# Patient Record
Sex: Female | Born: 1985 | Hispanic: Yes | Marital: Married | Smoking: Never smoker
Health system: Southern US, Community
[De-identification: ages and names within clinical notes are randomized; demographics above are authoritative.]

## PROBLEM LIST (undated history)

## (undated) DIAGNOSIS — I10 Essential (primary) hypertension: Secondary | ICD-10-CM

## (undated) NOTE — Progress Notes (Signed)
 Formatting of this note might be different from the original.  09/17/2014   These orders are being generated as the result of the [facility] Screening and Indicated Tests Ordering Protocol.  Signed by: Penne JAYSON Meth, LPN 02/07/7982, 7:65 PM  Electronically signed by Penne JAYSON Meth, LPN at 97/91/7983  2:35 PM MST

## (undated) NOTE — Addendum Note (Signed)
"   Addended by: CYNDEE PENNE BROCKS on: 09/17/2014 02:35 PM    Modules accepted: Orders, SmartSet   Electronically signed by Penne BROCKS Cyndee, LPN at 97/91/7983  2:35 PM MST "

## (undated) NOTE — Progress Notes (Signed)
 Formatting of this note might be different from the original. SUBJECTIVE: F/u on right foot sprain from 09/04/2014, missed step and twisted foot; had xrays done which were negative  Used crutches for first few  days after injury; did develop bruising a few days after clinic visit which has since resolved; also initial swelling much improved  Using cam walker still, sometimes not wearing it when around home, not wearing it when driving  Using ice at night time  Using ibuprofen once per day now  Due for labs Due for TdaP Last pap smear done 2 years, no hx of abnormal pap smears, declines pap today.   OBJECTIVE: gen; alert and Ox3, nad, well appearing BP 108/78 mmHg  Pulse 84  Temp(Src) 98.7 F (37.1 C) (Tympanic)  Resp 18  Ht 5' 3 (1.6 m)  Wt 174 lb 1.6 oz (78.971 kg)  BMI 30.85 kg/m2  SpO2 97%  LMP  (Exact Date) rigth foot; no edema, faint very small resolving ecchymosis at  Dorsal first web space  Mild ttp at 2nd, 3rd,4th metatarsals; rom intact ankle and toes   ASSESSMENT: RIGHT FOOT SPRAIN, SUBSEQ  (primary encounter diagnosis) VACCINATION FOR DIPHTHERIA, TETANUS AND ACELLULAR PERTUSSIS   PLAN: See pt. instructions   Electronically signed by Greig LITTIE Flor, MD at 09/17/2014  2:28 PM MST

## (undated) NOTE — Nursing Note (Signed)
 Formatting of this note might be different from the original. Patient identification verified, roomed and vital signs taken. Mitzie Gull LPN Nursing Float Pool 09/17/2014 2:02 PM   Electronically signed by Mitzie Gull, LPN at 97/91/7983  2:03 PM MST

---

## 2020-10-11 ENCOUNTER — Encounter (HOSPITAL_BASED_OUTPATIENT_CLINIC_OR_DEPARTMENT_OTHER): Payer: Self-pay | Admitting: Emergency Medicine

## 2020-10-11 ENCOUNTER — Other Ambulatory Visit: Payer: Self-pay

## 2020-10-11 ENCOUNTER — Emergency Department (HOSPITAL_BASED_OUTPATIENT_CLINIC_OR_DEPARTMENT_OTHER)
Admission: EM | Admit: 2020-10-11 | Discharge: 2020-10-11 | Disposition: A | Discharge status: Home / Self Care | Payer: Managed Care, Other (non HMO) | Attending: Emergency Medicine | Admitting: Emergency Medicine

## 2020-10-11 ENCOUNTER — Emergency Department (HOSPITAL_BASED_OUTPATIENT_CLINIC_OR_DEPARTMENT_OTHER): Payer: Managed Care, Other (non HMO)

## 2020-10-11 ENCOUNTER — Emergency Department (HOSPITAL_COMMUNITY): Payer: Managed Care, Other (non HMO)

## 2020-10-11 DIAGNOSIS — Z20822 Contact with and (suspected) exposure to covid-19: Secondary | ICD-10-CM | POA: Insufficient documentation

## 2020-10-11 DIAGNOSIS — R200 Anesthesia of skin: Secondary | ICD-10-CM | POA: Insufficient documentation

## 2020-10-11 DIAGNOSIS — R519 Headache, unspecified: Secondary | ICD-10-CM | POA: Insufficient documentation

## 2020-10-11 DIAGNOSIS — R001 Bradycardia, unspecified: Secondary | ICD-10-CM | POA: Insufficient documentation

## 2020-10-11 DIAGNOSIS — R2990 Unspecified symptoms and signs involving the nervous system: Secondary | ICD-10-CM | POA: Diagnosis not present

## 2020-10-11 DIAGNOSIS — I10 Essential (primary) hypertension: Secondary | ICD-10-CM | POA: Insufficient documentation

## 2020-10-11 DIAGNOSIS — R29810 Facial weakness: Secondary | ICD-10-CM | POA: Insufficient documentation

## 2020-10-11 DIAGNOSIS — R439 Unspecified disturbances of smell and taste: Secondary | ICD-10-CM | POA: Diagnosis present

## 2020-10-11 HISTORY — DX: Essential (primary) hypertension: I10

## 2020-10-11 LAB — RAPID URINE DRUG SCREEN, HOSP PERFORMED
Amphetamines: NOT DETECTED
Barbiturates: NOT DETECTED
Benzodiazepines: NOT DETECTED
Cocaine: NOT DETECTED
Opiates: NOT DETECTED
Tetrahydrocannabinol: NOT DETECTED

## 2020-10-11 LAB — RESP PANEL BY RT-PCR (FLU A&B, COVID) ARPGX2
Influenza A by PCR: NEGATIVE
Influenza B by PCR: NEGATIVE
SARS Coronavirus 2 by RT PCR: NEGATIVE

## 2020-10-11 LAB — CBC WITH DIFFERENTIAL/PLATELET
Abs Immature Granulocytes: 0.02 10*3/uL (ref 0.00–0.07)
Basophils Absolute: 0 10*3/uL (ref 0.0–0.1)
Basophils Relative: 0 %
Eosinophils Absolute: 0.1 10*3/uL (ref 0.0–0.5)
Eosinophils Relative: 1 %
HCT: 39.9 % (ref 36.0–46.0)
Hemoglobin: 13 g/dL (ref 12.0–15.0)
Immature Granulocytes: 0 %
Lymphocytes Relative: 27 %
Lymphs Abs: 2.8 10*3/uL (ref 0.7–4.0)
MCH: 29.2 pg (ref 26.0–34.0)
MCHC: 32.6 g/dL (ref 30.0–36.0)
MCV: 89.7 fL (ref 80.0–100.0)
Monocytes Absolute: 0.7 10*3/uL (ref 0.1–1.0)
Monocytes Relative: 7 %
Neutro Abs: 6.4 10*3/uL (ref 1.7–7.7)
Neutrophils Relative %: 65 %
Platelets: 286 10*3/uL (ref 150–400)
RBC: 4.45 MIL/uL (ref 3.87–5.11)
RDW: 13.2 % (ref 11.5–15.5)
WBC: 10 10*3/uL (ref 4.0–10.5)
nRBC: 0 % (ref 0.0–0.2)

## 2020-10-11 LAB — BASIC METABOLIC PANEL
Anion gap: 10 (ref 5–15)
BUN: 15 mg/dL (ref 6–20)
CO2: 26 mmol/L (ref 22–32)
Calcium: 9.4 mg/dL (ref 8.9–10.3)
Chloride: 103 mmol/L (ref 98–111)
Creatinine, Ser: 0.61 mg/dL (ref 0.44–1.00)
GFR, Estimated: >60 mL/min (ref >60)
Glucose, Bld: 110 mg/dL — ABNORMAL HIGH (ref 70–99)
Potassium: 4.1 mmol/L (ref 3.5–5.1)
Sodium: 139 mmol/L (ref 135–145)

## 2020-10-11 LAB — PREGNANCY, URINE: Preg Test, Ur: NEGATIVE

## 2020-10-11 MED ORDER — GADOBUTROL 1 MMOL/ML IV SOLN
7.5000 mL | Freq: Once | INTRAVENOUS | Status: AC | PRN
  Administered 2020-10-11: 7.5 mL via INTRAVENOUS

## 2020-10-11 NOTE — ED Provider Notes (Signed)
[facility] EMERGENCY DEPARTMENT Provider Note   CSN: [id number] Arrival date & time: 10/11/20  1050     History No chief complaint on file.   Monique Hayden is a 35 y.o. female who presents with concern for loss of taste that started yesterday with associated altered sensation on the RIGHT half of her face, states she feels like she needs to use excessive effort to blink or make facial expression on the right side.  This morning she noted that she felt like she could not grip things as strongly with her right hand that she can with her left.  Patient had video visit with her doctor this morning who noted a left-sided facial droop, and recommend she come to the emergency department to rule out a stroke.  Patient is tearful provisional exam, fearful as she has a 62-year-old daughter.   LEFT sided facial droop noted in triage.  Patient ambulatory without difficulty, moves all extremities with purpose and without difficulty.  She states she has intermittent headaches and migraines associated with her menstrual cycle, had a mild frontal headache this morning that resolved after ibuprofen administration.  I personally reviewed the patient's medical records.  She has history of gestational hypertension, but otherwise carries no medical diagnoses and is not on any medications every day.  She is not on any anticoagulation, has not had any recent travel or prolonged immobilization, no medical history of blood clots or malignancy.  Recent trauma or falls, patient is not an IV drug user.  HPI     Past Medical History:  Diagnosis Date  . Hypertension    gestational    There are no problems to display for this patient.      OB History   No obstetric history on file.     No family history on file.  Social History   Tobacco Use  . Smoking status: Never Smoker  . Smokeless tobacco: Never Used  Vaping Use  . Vaping Use: Never used  Substance Use Topics  . Alcohol use: Yes  .  Drug use: Never    Home Medications Prior to Admission medications   Not on File    Allergies    Patient has no known allergies.  Review of Systems   Review of Systems  Constitutional: Negative.   HENT: Negative.   Eyes: Negative.   Respiratory: Negative.   Cardiovascular: Negative.   Gastrointestinal: Negative.   Musculoskeletal: Negative.   Skin: Negative.   Neurological: Positive for facial asymmetry, weakness, numbness and headaches. Negative for dizziness, seizures, syncope, speech difficulty and light-headedness.  Hematological: Negative.   Psychiatric/Behavioral: Negative.     Physical Exam Updated Vital Signs BP 123/81 (BP Location: Right Arm)   Pulse 91   Temp 98.6 F (37 C) (Oral)   Resp (!) 22   Ht 5\' 3"  (1.6 m)   Wt 77.1 kg   SpO2 97%   BMI 30.11 kg/m   Physical Exam Vitals and nursing note reviewed.  Constitutional:      Appearance: She is overweight.  HENT:     Head: Normocephalic and atraumatic.     Comments: Facial asymmetry, with LEFT facial droop    Right Ear: Hearing normal.     Left Ear: Hearing normal.     Nose: Nose normal.     Mouth/Throat:     Mouth: Mucous membranes are moist.     Pharynx: Oropharynx is clear. Uvula midline. No oropharyngeal exudate, posterior oropharyngeal erythema or uvula swelling.  Tonsils: No tonsillar exudate.  Eyes:     General: Lids are normal. Vision grossly intact. Gaze aligned appropriately. No visual field deficit.       Right eye: No discharge.        Left eye: No discharge.     Extraocular Movements: Extraocular movements intact.     Right eye: No nystagmus.     Left eye: No nystagmus.     Conjunctiva/sclera: Conjunctivae normal.     Pupils: Pupils are equal, round, and reactive to light.  Neck:     Trachea: Trachea and phonation normal.  Cardiovascular:     Rate and Rhythm: Normal rate and regular rhythm.     Pulses: Normal pulses.          Radial pulses are 2+ on the right side and 2+ on  the left side.       Dorsalis pedis pulses are 2+ on the right side and 2+ on the left side.     Heart sounds: Normal heart sounds. No murmur heard.   Pulmonary:     Effort: Pulmonary effort is normal. No tachypnea or respiratory distress.     Breath sounds: Normal breath sounds. No wheezing or rales.  Chest:     Chest wall: No lacerations, deformity, swelling, tenderness, crepitus or edema.  Abdominal:     General: Bowel sounds are normal. There is no distension.     Palpations: Abdomen is soft.     Tenderness: There is no abdominal tenderness.  Musculoskeletal:        General: No deformity.     Cervical back: Neck supple. No crepitus. No pain with movement, spinous process tenderness or muscular tenderness.     Right lower leg: No edema.     Left lower leg: No edema.     Comments: Asymmetric grip strength with 5/5 grip strength on the left hand, and 3/5 grip strength on the right.  Similar asymmetry with elbow flexion and extension.  5/5 strength in elbow flexion extension on the left, 3/5 strength in flexion extension of the elbow on the right. 5/5 strength in plantar dorsiflexion bilaterally.  Lymphadenopathy:     Cervical: No cervical adenopathy.  Skin:    General: Skin is warm and dry.  Neurological:     Mental Status: She is alert and oriented to person, place, and time. Mental status is at baseline.     Cranial Nerves: Facial asymmetry present. No dysarthria.     Motor: Weakness present. No abnormal muscle tone or pronator drift.     Coordination: Coordination is intact. Romberg sign negative. Heel to [facility] Test normal.     Gait: Gait is intact.     Comments: Facial asymmetry with left-sided facial droop without dysarthria.  CN II-VI, VIII-XII intact, gag reflex not assessed.   Altered sensation to light touch in RUE with associated RUE weakness.   Psychiatric:        Mood and Affect: Mood normal.     ED Results / Procedures / Treatments   Labs (all labs ordered are  listed, but only abnormal results are displayed) Labs Reviewed  BASIC METABOLIC PANEL - Abnormal; Notable for the following components:      Result Value   Glucose, Bld 110 (*)    All other components within normal limits  CBC WITH DIFFERENTIAL/PLATELET  PREGNANCY, URINE  RAPID URINE DRUG SCREEN, HOSP PERFORMED    EKG None  Radiology CT Head Wo Contrast  Result Date: 10/11/2020 CLINICAL DATA:  Facial numbness and tongue numbness. EXAM: CT HEAD WITHOUT CONTRAST TECHNIQUE: Contiguous axial images were obtained from the base of the skull through the vertex without intravenous contrast. COMPARISON:  None. FINDINGS: Brain: No evidence of acute infarction, hemorrhage, hydrocephalus, extra-axial collection or mass lesion/mass effect. Vascular: No hyperdense vessel or unexpected calcification. Skull: Normal. Negative for fracture or focal lesion. Sinuses/Orbits: No acute finding. CLear mastoids. Unremarkable parotids. IMPRESSION: Negative head CT. Electronically Signed   By: Marnee SpringJonathon  Watts M.D.   On: 10/11/2020 11:55    Procedures Procedures   Medications Ordered in ED Medications - No data to display  ED Course  I have reviewed the triage vital signs and the nursing notes.  Pertinent labs & imaging results that were available during my care of the patient were reviewed by me and considered in my medical decision making (see chart for details).  Clinical Course as of 10/11/20 1301  Fri Oct 11, 2020  1225 [facility], Dr. Rodena MedinMessick, who is agreeable to receiving this patient and coordinating with neurology.  [RS]    Clinical Course User Index [RS] Daxton Nydam, Eugene Gaviaebekah R, PA-C   MDM Rules/Calculators/A&P                         35 year old female presents with concern for 1 day of altered sensation in the right half of her face, now with left-sided facial droop and right upper extremity weakness.  No history of multiple sclerosis.  Hypertensive and tachycardic on intake,  tearful in triage.  Cardiopulmonary exam is normal, she is alert tachycardic at time of my exam, abdominal exam is benign.  Neurologic exam revealed left-sided facial droop and right upper extremity weakness and altered sensation.  No other deficit identified on exam.  No visual field loss, patient is able to articulate her speech well.  The differential diagnosis of weakness includes but is not limited to: Marland Kitchen. Neurologic causes: GBS, myasthenia gravis, CVA, MS, ALS, transverse myelitis, spinal cord injury, CVA, botulism . Other causes: ACS, Arrhythmia, syncope, orthostatic hypotension, sepsis, hypoglycemia, electrolyte disturbance, hypothyroidism, respiratory failure, symptomatic anemia, dehydration, heat injury, polypharmacy, malignancy.  No concern for CVA at this time neurologic exam abnormalities inconsistent with CVA, concern for possible multiple sclerosis.  The patient will undergo basic laboratory studies and CT of the head in the emergency department, however I feel she would be best served by undergoing an MRI.  We will arrange ED [facility] ED transfer [facility] for [facility] and neurologic evaluation.  Normal head CT, no concern for intracranial mass at this time. Will arrange ED to ED transfer.  Patient's husband is at the bedside, and is appropriate for [facility].  She is medically stable and appropriate for transfer by POV at this time.  Accepting [facility] at [facility] emergency department Dr. Rodena MedinMessick.  Consult placed to neurology, Dr. Amada JupiterKirkpatrick, who agrees with MRI and plan for ED to ED transfer [facility]. He is agreeable to seeing [facility] when [facility]. I appreciate his collaboration in the care of this patient.   This chart was dictated using voice recognition software, Dragon. Despite the best efforts of this provider to proofread and correct errors, errors may still occur which can change documentation meaning.  Final Clinical Impression(s)  / ED Diagnoses Final diagnoses:  Neurosensory deficit    Rx / DC Orders ED Discharge Orders    None  Sherrilee Gilles 10/11/20 1301    Gwyneth Sprout, MD 10/12/20 2205

## 2020-10-11 NOTE — ED Notes (Signed)
Spoke to MRI to give them a heads up

## 2020-10-11 NOTE — Discharge Instructions (Addendum)
Your work-up today was overall reassuring.  Your MRI and CT scans did not show any evidence of stroke or MS.  As discussed, we think this is less likely Bell's palsy given your upper extremity symptoms which have now resolved.  Please make sure to follow-up with neurology in the outpatient setting, provided their contact information in your discharge paperwork.  Return to the ER for any new or worsening symptoms peer

## 2020-10-11 NOTE — ED Notes (Signed)
Husband at bedside , pt to go to  [facility] for MRI with IV secured , report to [facility] RN charge at [facility]

## 2020-10-11 NOTE — ED Notes (Signed)
Husband at bedside.  

## 2020-10-11 NOTE — ED Triage Notes (Signed)
Loss of sense of taste yesterday and today , this am  Numbness on rt side  Of face and couldn't blink  Rt side   Hand grip not as strong as left  ,  Left side of face is not  Not as strong with smile , MAE with purpose

## 2020-10-11 NOTE — ED Provider Notes (Addendum)
Patient transferred over to [facility] for MRI, initially seen at [facility] with concerns for possible MS.  Patient evaluated by Mauro Kaufmann, please see her note for full HPI.  In short, 35 year old female who complains of left-sided facial droop, loss of taste, and inability to blink as of this morning.  She also had complained of a headache.  Evaluated via telemedicine by PCP and was noted to have left-sided facial droop.  Sent to the ER for rule out of stroke.  She had a visible noted left-sided facial droop on exam, with 3/5 strength in her right arm with gripping and flexion and extension of the elbows.  She had intact and equal strength in her lower extremities.  No noted dysarthria.  CT of the head, lab work overall reassuring.  Prior provider had spoken with with Dr. Amada Jupiter who recommended transfer over to [facility] for MRI to rule out MS.  On arrival, on my exam, patient now has 5/5 grip strength with equal flexion and extension of her upper extremities bilaterally.  She is able to lift her left eyebrow, no notable dysarthria on exam, however she does have a left-sided facial droop. Able to close both eyes completely.  She is able to mildly keep the left cheek taut when puffing out her cheeks. Equal strength in lower extremities bilaterally.   I personally reviewed her MRI which showed no acute abnormalities.  I spoke with Dr. Amada Jupiter again who thinks this could be either complicated migraine or psychogenic.  ABCD 2 score less than 2, less likely TIA or carotid artery dissection/CVA.  Less likely Bell's palsy given upper extremity weakness at presentation.  He does not think a course of steroids would be helpful at this time.  He recommends outpatient follow-up.  We discussed possible Covid testing with the patient, to which she is agreeable to.  We will test today, though this would not change our course of management.  Patient given outpatient neurology follow-up, stressed  following up with them.  Return precautions discussed.  She voiced understanding and is agreeable.  Case discussed with Dr. Stevie Kern who is agreeable to the above plan and disposition  Results for orders placed or performed during the hospital encounter of 10/11/20  Basic metabolic panel  Result Value Ref Range   Sodium 139 135 - 145 mmol/L   Potassium 4.1 3.5 - 5.1 mmol/L   Chloride 103 98 - 111 mmol/L   CO2 26 22 - 32 mmol/L   Glucose, Bld 110 (H) 70 - 99 mg/dL   BUN 15 6 - 20 mg/dL   Creatinine, Ser 4.09 0.44 - 1.00 mg/dL   Calcium 9.4 8.9 - 81.1 mg/dL   GFR, Estimated >91 >47 mL/min   Anion gap 10 5 - 15  CBC with Differential  Result Value Ref Range   WBC 10.0 4.0 - 10.5 K/uL   RBC 4.45 3.87 - 5.11 MIL/uL   Hemoglobin 13.0 12.0 - 15.0 g/dL   HCT 82.9 56.2 - 13.0 %   MCV 89.7 80.0 - 100.0 fL   MCH 29.2 26.0 - 34.0 pg   MCHC 32.6 30.0 - 36.0 g/dL   RDW 86.5 78.4 - 69.6 %   Platelets 286 150 - 400 K/uL   nRBC 0.0 0.0 - 0.2 %   Neutrophils Relative % 65 %   Neutro Abs 6.4 1.7 - 7.7 K/uL   Lymphocytes Relative 27 %   Lymphs Abs 2.8 0.7 - 4.0 K/uL   Monocytes  Relative 7 %   Monocytes Absolute 0.7 0.1 - 1.0 K/uL   Eosinophils Relative 1 %   Eosinophils Absolute 0.1 0.0 - 0.5 K/uL   Basophils Relative 0 %   Basophils Absolute 0.0 0.0 - 0.1 K/uL   Immature Granulocytes 0 %   Abs Immature Granulocytes 0.02 0.00 - 0.07 K/uL  Pregnancy, urine  Result Value Ref Range   Preg Test, Ur NEGATIVE NEGATIVE  Urine rapid drug screen (hosp performed)  Result Value Ref Range   Opiates NONE DETECTED NONE DETECTED   Cocaine NONE DETECTED NONE DETECTED   Benzodiazepines NONE DETECTED NONE DETECTED   Amphetamines NONE DETECTED NONE DETECTED   Tetrahydrocannabinol NONE DETECTED NONE DETECTED   Barbiturates NONE DETECTED NONE DETECTED   CT Head Wo Contrast  Result Date: 10/11/2020 CLINICAL DATA:  Facial numbness and tongue numbness. EXAM: CT HEAD WITHOUT CONTRAST TECHNIQUE: Contiguous  axial images were obtained from the base of the skull through the vertex without intravenous contrast. COMPARISON:  None. FINDINGS: Brain: No evidence of acute infarction, hemorrhage, hydrocephalus, extra-axial collection or mass lesion/mass effect. Vascular: No hyperdense vessel or unexpected calcification. Skull: Normal. Negative for fracture or focal lesion. Sinuses/Orbits: No acute finding. CLear mastoids. Unremarkable parotids. IMPRESSION: Negative head CT. Electronically Signed   By: Marnee Spring M.D.   On: 10/11/2020 11:55   MR Brain W and Wo Contrast  Result Date: 10/11/2020 CLINICAL DATA:  Right-sided weakness. Left facial droop. Question multiple sclerosis. EXAM: MRI HEAD WITHOUT AND WITH CONTRAST TECHNIQUE: Multiplanar, multiecho pulse sequences of the brain and surrounding structures were obtained without and with intravenous contrast. CONTRAST:  7.54mL GADAVIST GADOBUTROL 1 MMOL/ML IV SOLN COMPARISON:  Head CT same day FINDINGS: Brain: The brain has a normal appearance without evidence of malformation, atrophy, old or acute small or large vessel infarction, mass lesion, hemorrhage, hydrocephalus or extra-axial collection. No evidence of demyelinating disease. After contrast administration, no abnormal enhancement occurs. Vascular: Major vessels at the base of the brain show flow. Venous sinuses appear patent. Skull and upper cervical spine: Normal. Sinuses/Orbits: Clear/normal. Other: None significant. IMPRESSION: Normal examination. No evidence of demyelinating disease. Electronically Signed   By: Paulina Fusi M.D.   On: 10/11/2020 15:02         Mare Ferrari, PA-C 10/11/20 1617    Milagros Loll, MD 10/14/20 [phone]

## 2020-12-18 ENCOUNTER — Encounter: Payer: Self-pay | Admitting: Diagnostic Neuroimaging

## 2020-12-18 ENCOUNTER — Telehealth: Payer: Self-pay | Admitting: *Deleted

## 2020-12-18 ENCOUNTER — Ambulatory Visit: Payer: Managed Care, Other (non HMO) | Admitting: Diagnostic Neuroimaging

## 2020-12-18 NOTE — Telephone Encounter (Signed)
Patient was no show for new patient appointment today. Of note, she saw [facility] on 10/30/20. No FU scheduled.

## 2022-06-23 IMAGING — MR MR HEAD WO/W CM
7 of 13 series · 26 of 48 positions shown · IV contrast (gadavist)
Comparison: Head CT same day

CLINICAL DATA: Right-sided weakness. Left facial droop. Question
multiple sclerosis.

EXAM:
MRI HEAD WITHOUT AND WITH CONTRAST
TECHNIQUE: Multiplanar, multiecho pulse sequences of the brain and surrounding
structures were obtained without and with intravenous contrast.
CONTRAST:  7.5mL GADAVIST GADOBUTROL 1 MMOL/ML IV SOLN

[Series 2: DWI · axial · 3.0mm · 0.94mm/px · z∈[-94,+48]mm · 7 of 100 slices shown (1 of 2)]
[im 1/100]
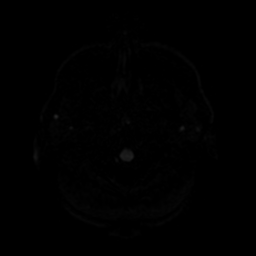
[im 17/100]
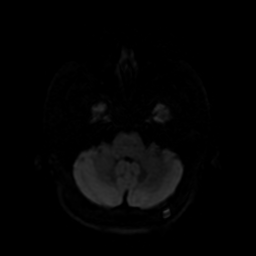
[im 34/100]
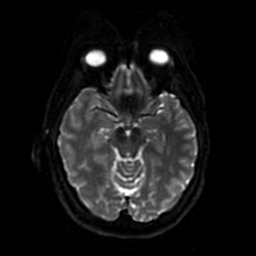
[im 50/100]
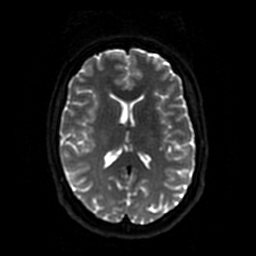
[im 67/100]
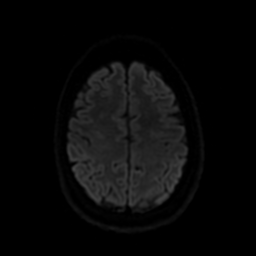
[im 83/100]
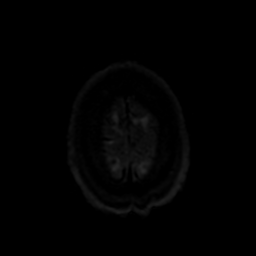
[im 100/100]
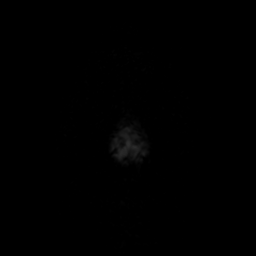

[Series 3: DWI · coronal · 4.0mm · 0.94mm/px · 6 of 74 slices shown (2 of 2)]
[im 1/74]
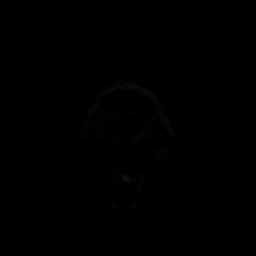
[im 15/74]
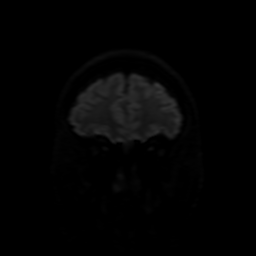
[im 30/74]
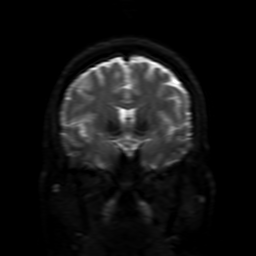
[im 44/74]
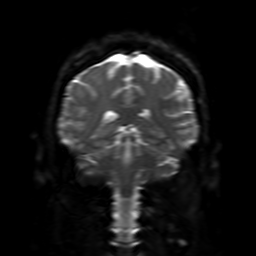
[im 59/74]
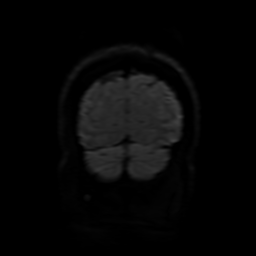
[im 74/74]
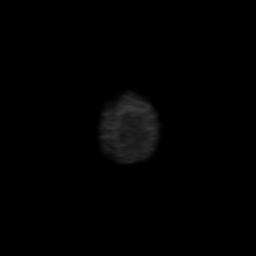

[Series 4: FLAIR · sagittal · 5.0mm · 0.23mm/px · 2 of 27 slices shown (1 of 2)]
[im 1/27]
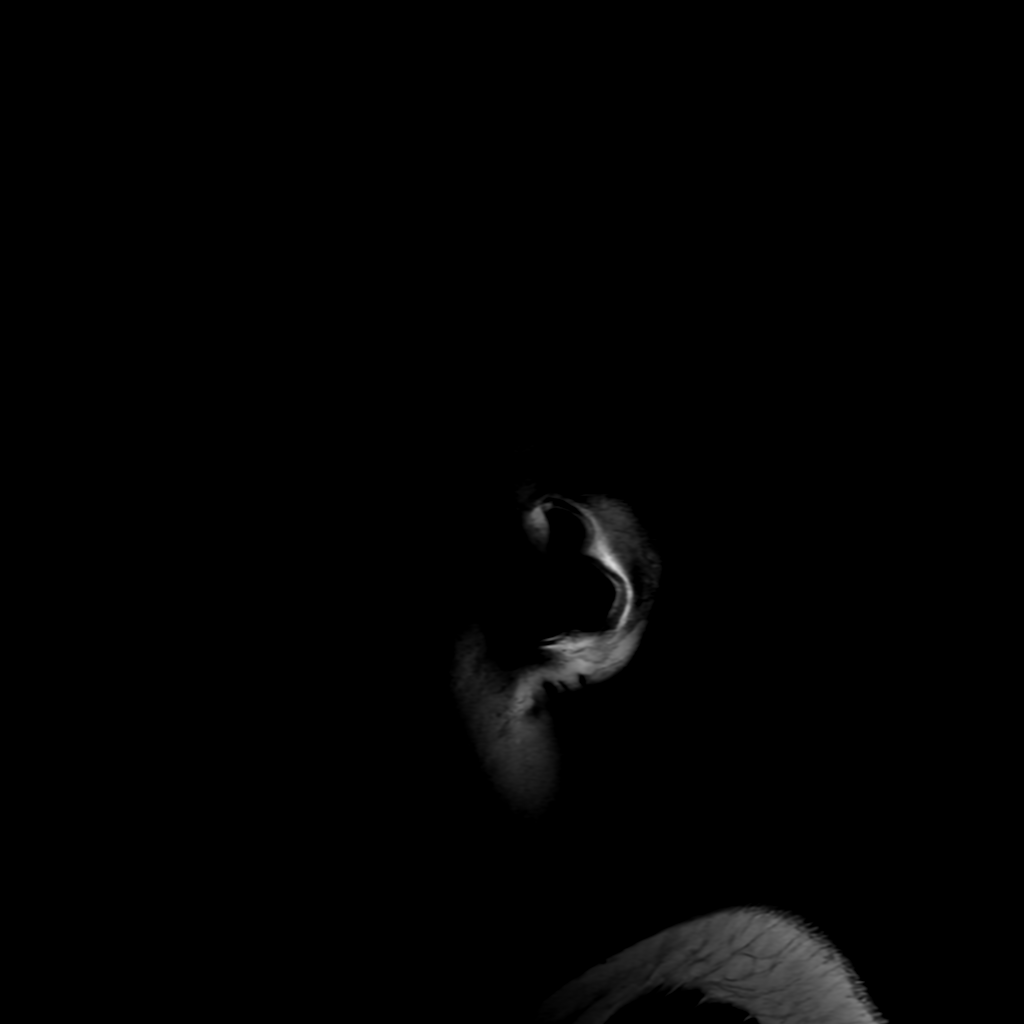
[im 27/27]
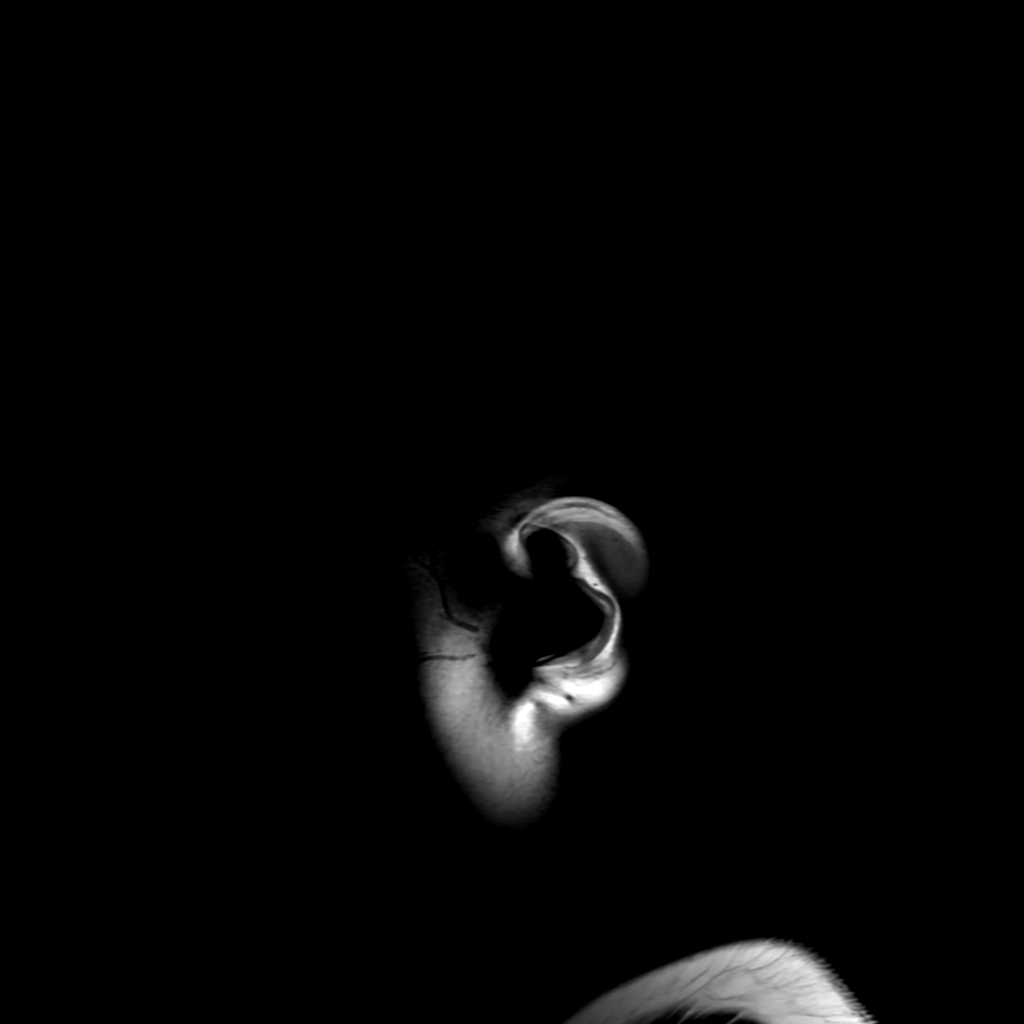

[Series 6: FLAIR · axial · 3.0mm · 0.45mm/px · z∈[-89,+57]mm · 2 of 26 slices shown (2 of 2)]
[im 1/26]
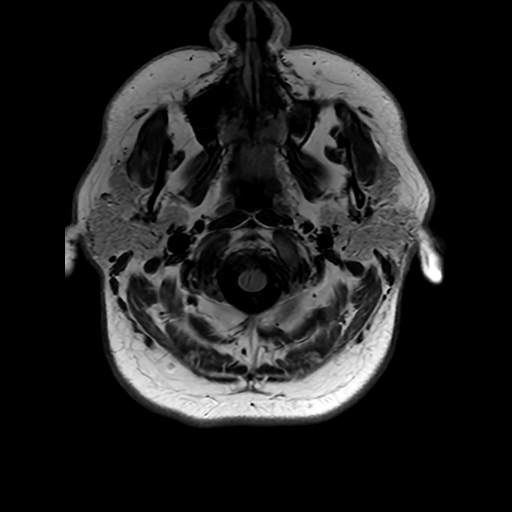
[im 26/26]
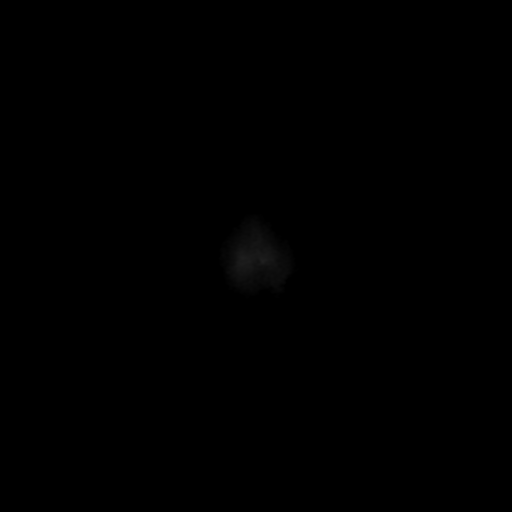

[Series 12: FLAIR post-contrast · sagittal · 5.0mm · 0.47mm/px · 2 of 27 slices shown]
[im 1/27]
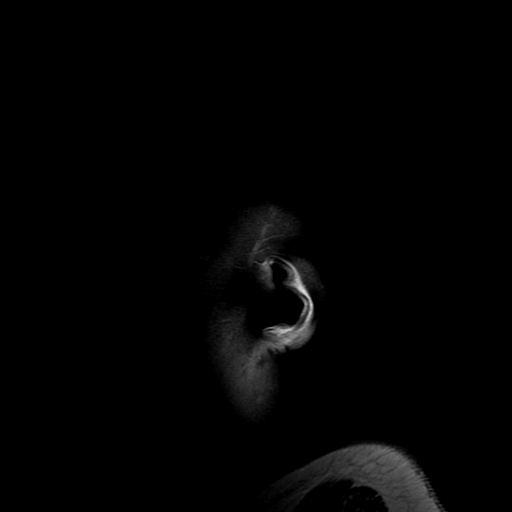
[im 27/27]
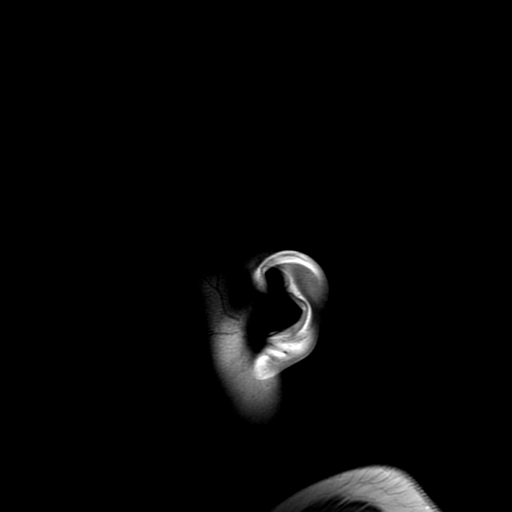

[Series 250: ADC · axial · 3.0mm · 0.94mm/px · z∈[-94,+48]mm · 4 of 50 slices shown (1 of 2)]
[im 1/50]
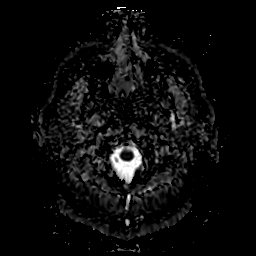
[im 17/50]
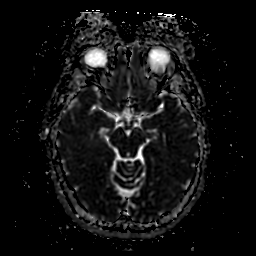
[im 33/50]
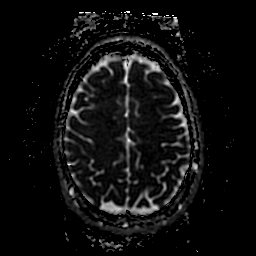
[im 50/50]
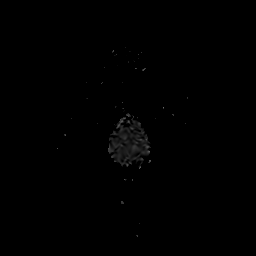

[Series 350: ADC · coronal · 4.0mm · 0.94mm/px · 3 of 37 slices shown (2 of 2)]
[im 1/37]
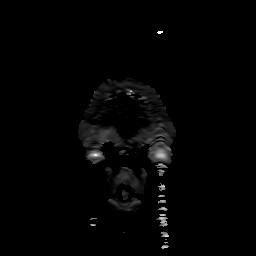
[im 19/37]
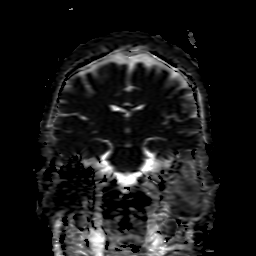
[im 37/37]
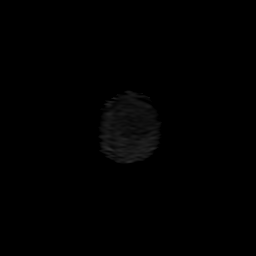

[26 of 48 positions shown; findings below may reference images not displayed]

FINDINGS: Brain: The brain has a normal appearance without evidence of
malformation, atrophy, old or acute small or large vessel
infarction, mass lesion, hemorrhage, hydrocephalus or extra-axial
collection. No evidence of demyelinating disease. After contrast
administration, no abnormal enhancement occurs.

Vascular: Major vessels at the base of the brain show flow. Venous
sinuses appear patent.

Skull and upper cervical spine: Normal.

Sinuses/Orbits: Clear/normal.

Other: None significant.
IMPRESSION: Normal examination. No evidence of demyelinating disease.

## 2024-08-11 ENCOUNTER — Encounter: Payer: Self-pay | Admitting: Emergency Medicine

## 2024-08-11 ENCOUNTER — Ambulatory Visit: Payer: Self-pay

## 2024-08-11 ENCOUNTER — Ambulatory Visit
Admission: EM | Admit: 2024-08-11 | Discharge: 2024-08-11 | Disposition: A | Discharge status: Home / Self Care | Attending: Emergency Medicine | Admitting: Emergency Medicine

## 2024-08-11 DIAGNOSIS — B9689 Other specified bacterial agents as the cause of diseases classified elsewhere: Secondary | ICD-10-CM

## 2024-08-11 DIAGNOSIS — J019 Acute sinusitis, unspecified: Secondary | ICD-10-CM | POA: Diagnosis not present

## 2024-08-11 DIAGNOSIS — R519 Headache, unspecified: Secondary | ICD-10-CM | POA: Diagnosis not present

## 2024-08-11 MED ORDER — AMOXICILLIN-POT CLAVULANATE 875-125 MG PO TABS: 1.0000 | ORAL_TABLET | Freq: Two times a day (BID) | ORAL | 0 refills | Status: AC

## 2024-08-11 MED ORDER — PREDNISONE 20 MG PO TABS: 40.0000 mg | ORAL_TABLET | Freq: Every day | ORAL | 0 refills | Status: AC

## 2024-08-11 NOTE — ED Triage Notes (Signed)
 Pt reports since 12/25 had sore throat and bilateral ear pain. Was seen at [facility] on 12/27 and had strep test that was negative. Was told had fluid in her ears. Told to take flonase and Zyrtec. Taking ibuprofen 800 mg, last dose was 830 am today.

## 2024-08-11 NOTE — Discharge Instructions (Addendum)
 Take the Augmentin twice daily with food for the next 7 days Continue doing over-the-counter nasal sinus rinses with filtered water Start the prednisone today and then take it daily with breakfast to help with pain and inflammation You can alternate between 500 mg of Tylenol and 800 mg of ibuprofen every 4-6 hours to help with pain  Symptoms should improve with antibiotics.  If no improvement or any changes seek follow-up care

## 2024-08-11 NOTE — ED Provider Notes (Signed)
 " [facility]    CSN: [id number] Arrival date & time: 08/11/24  1357      History   Chief Complaint Chief Complaint  Patient presents with   Sore Throat   Otalgia    HPI Monique Hayden is a 39 y.o. female.   Presents to clinic over concern of ear pain and throat pain x8-9 days   12/25 had sore throat and then ear pain w/ minimal congestion and some cough (maybe PND) on 12/26  Has had horrible migraine headaches, no fever (hx of migraines) On 12/27 she had negative POC strep swab (no culture)  Was not tested for Covid or flu  Was given Flonase and zyrtec to help with ear pain and pressure  Was told to come back if no improvement in 5 days Last night had worse ear pain and throat pain Taking 800 mg ibuprofen and this will dull the pain, taking this q 6 hours   Dry cough, denies wheezing or shortness of breath   The history is provided by the patient and medical records.  Sore Throat  Otalgia   Past Medical History:  Diagnosis Date   Hypertension    gestational    There are no active problems to display for this patient.   History reviewed. No pertinent surgical history.  OB History   No obstetric history on file.      Home Medications    Prior to Admission medications  Medication Sig Start Date End Date Taking? Authorizing Provider  amoxicillin-clavulanate (AUGMENTIN) 875-125 MG tablet Take 1 tablet by mouth every 12 (twelve) hours. 08/11/24  Yes Jaesean Litzau  N, FNP  ECHINACEA PO Take 1 capsule by mouth daily.    [provider]  ibuprofen (ADVIL) 200 MG tablet Take 200 mg by mouth every 6 (six) hours as needed for headache.    [provider]  Multiple Vitamins-Minerals (MULTIVITAMIN ADULTS) TABS Take 1 tablet by mouth daily.    [provider]  Oregano 1500 MG CAPS Take 1 capsule by mouth daily.    [provider]  predniSONE (DELTASONE) 20 MG tablet Take 2 tablets (40 mg total) by mouth daily with  breakfast for 5 days. 08/11/24 08/16/24 Yes Dreama, Joh Rao  N, FNP    Family History No family history on file.  Social History Social History[1]   Allergies   Patient has no known allergies.   Review of Systems Review of Systems  Per HPI  Physical Exam Triage Vital Signs ED Triage Vitals  Encounter Vitals Group     BP 08/11/24 1415 138/85     Girls Systolic BP Percentile --      Girls Diastolic BP Percentile --      Boys Systolic BP Percentile --      Boys Diastolic BP Percentile --      Pulse Rate 08/11/24 1415 89     Resp 08/11/24 1415 16     Temp 08/11/24 1415 98.3 F (36.8 C)     Temp Source 08/11/24 1415 Oral     SpO2 08/11/24 1415 98 %     Weight --      Height --      Head Circumference --      Peak Flow --      Pain Score 08/11/24 1412 5     Pain Loc --      Pain Education --      Exclude from Growth Chart --    No data found.  Updated  Vital Signs BP 138/85 (BP Location: Right Arm)   Pulse 89   Temp 98.3 F (36.8 C) (Oral)   Resp 16   LMP 07/31/2024 (Exact Date)   SpO2 98%   Visual Acuity Right Eye Distance:   Left Eye Distance:   Bilateral Distance:    Right Eye Near:   Left Eye Near:    Bilateral Near:     Physical Exam Vitals and nursing note reviewed.  Constitutional:      Appearance: Normal appearance. She is well-developed.  HENT:     Head: Normocephalic and atraumatic.     Right Ear: Tympanic membrane, ear canal and external ear normal.     Left Ear: Tympanic membrane, ear canal and external ear normal.     Nose: Congestion present.     Mouth/Throat:     Mouth: Mucous membranes are moist.     Pharynx: Posterior oropharyngeal erythema present.     Tonsils: No tonsillar exudate or tonsillar abscesses.  Eyes:     Conjunctiva/sclera: Conjunctivae normal.  Cardiovascular:     Rate and Rhythm: Normal rate and regular rhythm.     Heart sounds: Normal heart sounds. No murmur heard. Pulmonary:     Effort: Pulmonary effort is  normal. No respiratory distress.     Breath sounds: Normal breath sounds. No wheezing.  Skin:    General: Skin is warm and dry.  Neurological:     General: No focal deficit present.     Mental Status: She is alert and oriented to person, place, and time.  Psychiatric:        Mood and Affect: Mood normal.        Behavior: Behavior normal.      UC Treatments / Results  Labs (all labs ordered are listed, but only abnormal results are displayed) Labs Reviewed - No data to display  EKG   Radiology No results found.  Procedures Procedures (including critical care time)  Medications Ordered in UC Medications - No data to display  Initial Impression / Assessment and Plan / UC Course  I have reviewed the triage vital signs and the nursing notes.  Pertinent labs & imaging results that were available during my care of the patient were reviewed by me and considered in my medical decision making (see chart for details).  Vitals and triage reviewed, patient is hemodynamically stable.  Lungs vesicular, heart with regular rate and rhythm.  Congestion and postnasal drip present.  Bilateral frontal sinus tenderness to palpation, pressure and headache increased when bending forward.  Suspicious for bacterial sinusitis, will treat with Augmentin due to symptoms and duration.  Prednisone sent in for sinus pressure, ear pain and throat pain.  Plan of care, follow-up care return precautions given, no questions at this time.    Final Clinical Impressions(s) / UC Diagnoses   Final diagnoses:  Sinus headache  Acute bacterial sinusitis     Discharge Instructions      Take the Augmentin twice daily with food for the next 7 days Continue doing over-the-counter nasal sinus rinses with filtered water Start the prednisone today and then take it daily with breakfast to help with pain and inflammation You can alternate between 500 mg of Tylenol and 800 mg of ibuprofen every 4-6 hours to help  with pain  Symptoms should improve with antibiotics.  If no improvement or any changes seek follow-up care     ED Prescriptions     Medication Sig Dispense Auth. Provider   amoxicillin-clavulanate (  AUGMENTIN) 875-125 MG tablet Take 1 tablet by mouth every 12 (twelve) hours. 14 tablet Dreama, Wladyslaw Henrichs  N, FNP   predniSONE (DELTASONE) 20 MG tablet Take 2 tablets (40 mg total) by mouth daily with breakfast for 5 days. 10 tablet Dreama, Estil Vallee  N, FNP      PDMP not reviewed this encounter.     [1]  Social History Tobacco Use   Smoking status: Never   Smokeless tobacco: Never  Vaping Use   Vaping status: Never Used  Substance Use Topics   Alcohol use: Yes   Drug use: Never     Dreama Lorita SAILOR, FNP 08/11/24 1457  "
